# Patient Record
Sex: Male | Born: 2000 | Race: Black or African American | Hispanic: No | Marital: Single | Smoking: Never smoker
Health system: Southern US, Community
[De-identification: ages and names within clinical notes are randomized; demographics above are authoritative.]

## PROBLEM LIST (undated history)

## (undated) DIAGNOSIS — R51 Headache: Secondary | ICD-10-CM

## (undated) DIAGNOSIS — F309 Manic episode, unspecified: Secondary | ICD-10-CM

## (undated) DIAGNOSIS — F329 Major depressive disorder, single episode, unspecified: Secondary | ICD-10-CM

## (undated) DIAGNOSIS — R5383 Other fatigue: Secondary | ICD-10-CM

## (undated) DIAGNOSIS — F32A Depression, unspecified: Secondary | ICD-10-CM

## (undated) DIAGNOSIS — R519 Headache, unspecified: Secondary | ICD-10-CM

## (undated) DIAGNOSIS — F259 Schizoaffective disorder, unspecified: Secondary | ICD-10-CM

## (undated) DIAGNOSIS — F29 Unspecified psychosis not due to a substance or known physiological condition: Secondary | ICD-10-CM

## (undated) DIAGNOSIS — F419 Anxiety disorder, unspecified: Secondary | ICD-10-CM

## (undated) HISTORY — DX: Schizoaffective disorder, unspecified: F25.9

## (undated) HISTORY — DX: Depression, unspecified: F32.A

## (undated) HISTORY — DX: Headache, unspecified: R51.9

## (undated) HISTORY — DX: Anxiety disorder, unspecified: F41.9

## (undated) HISTORY — DX: Manic episode, unspecified: F30.9

## (undated) HISTORY — DX: Major depressive disorder, single episode, unspecified: F32.9

## (undated) HISTORY — DX: Other fatigue: R53.83

## (undated) HISTORY — DX: Unspecified psychosis not due to a substance or known physiological condition: F29

## (undated) HISTORY — DX: Headache: R51

---
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
## 2019-01-10 ENCOUNTER — Encounter: Payer: Self-pay | Admitting: Psychiatry

## 2019-01-10 ENCOUNTER — Ambulatory Visit (INDEPENDENT_AMBULATORY_CARE_PROVIDER_SITE_OTHER): Payer: BLUE CROSS/BLUE SHIELD | Admitting: Psychiatry

## 2019-01-10 VITALS — BP 136/72 | HR 99 | Ht 71.0 in | Wt 145.0 lb

## 2019-01-10 DIAGNOSIS — F411 Generalized anxiety disorder: Secondary | ICD-10-CM | POA: Insufficient documentation

## 2019-01-10 DIAGNOSIS — F250 Schizoaffective disorder, bipolar type: Secondary | ICD-10-CM | POA: Diagnosis not present

## 2019-01-10 DIAGNOSIS — F259 Schizoaffective disorder, unspecified: Secondary | ICD-10-CM | POA: Insufficient documentation

## 2019-01-10 NOTE — Progress Notes (Signed)
[facility] MD/PA/NP Initial Note  01/10/2019 10:42 PM Alan Morrow  MRN:  [id number]  Chief Complaint:  Chief Complaint    Schizophrenia; Manic Behavior; Depression; Anxiety      HPI: Alan Morrow is seen individually and conjointly with both parents 45 minutes face-to-face with consent not collateral at the request of Dr. Toni Arthurs not otherwise specified for adolescent psychiatric interview and exam in evaluation and management of, mood and anxiety symptoms.  Family reports at least 18 months of psychotic symptoms with longstanding history of anxiety likely generalized and course of social and sometimes somatic complaints followed by neurology most which may be only partially reported symptoms also.  Topamax seemed to help his headaches significantly.  Hallucinations are described as auditory more than visual with chaotic piercing qualities he considers frightening while mother sees a dark shadow over his face at times significantly psychotic.  His mood and cognitive energy today are significantly expansive and socially active suggesting hypomania at this time.  However they also describe times of significant involution when he cannot function even separate  from the hallucinations.  He has adventitial bouncing of the right leg as well as nail biting and knuckle popping suggesting significant generalized anxiety.  Significant family history of affective, psychotic, anxiety, and somatic components to symptom course are also noted.  He is not currently exhibiting self-harm or harm to others.  He does not describe morbid fixation but has anxious dream of being in water as if about to drown, but he remains reasonably expansive at times.  He seems to currently be taking Seroquel 500 mg daily and does not acknowledge Lamictal though it may be 150 mg.  Patient is distressed that he has been researching his symptoms and trying to diagnose when he is told by most providers that diagnosis is not valid at his age.  Visit  Diagnosis:    ICD-10-CM   1. Schizoaffective disorder, bipolar type (HCC) F25.0   2. Generalized anxiety disorder F41.1     Past Psychiatric History: Zoloft, Ativan, Abilify possibly Lamictal have been tried with either side effects or no success by dietary, while neurology has treated with propranolol, tryptans, and amitriptyline that much benefit but Topamax was very helpful.  He is currently doing best on Seroquel.  Past Medical History:  Past Medical History:  Diagnosis Date  . Anxiety   . Depression   . Fatigue   . Headache   . Mania (HCC)   . Psychosis (HCC)   . Schizoaffective disorder (HCC)    History reviewed. No pertinent surgical history.  Family Psychiatric History: Family history of schizophrenia, bipolar, addiction, depression and anxiety, suicide attempts, and migraine  Family History:  Family History  Problem Relation Age of Onset  . Migraines Mother   . Anxiety disorder Mother   . Anxiety disorder Sister   . Depression Maternal Aunt   . Suicidality Maternal Aunt   . Depression Paternal Aunt   . Post-traumatic stress disorder Paternal Aunt   . Seizures Paternal Uncle   . Migraines Paternal Uncle   . Depression Paternal Grandmother   . Schizophrenia Other   . Drug abuse Other     Social History:  Social History   Socioeconomic History  . Marital status: Single    Spouse name: Not on file  . Number of children: Not on file  . Years of education: Not on file  . Highest education level: 11th grade  Occupational History  . Not on file  Social Needs  .  Financial resource strain: Not hard at all  . Food insecurity:    Worry: Never true    Inability: Never true  . Transportation needs:    Medical: No    Non-medical: No  Tobacco Use  . Smoking status: Never Smoker  . Smokeless tobacco: Never Used  Substance and Sexual Activity  . Alcohol use: Never    Frequency: Never  . Drug use: Never  . Sexual activity: Never  Lifestyle  . Physical  activity:    Days per week: 0 days    Minutes per session: 0 min  . Stress: Rather much  Relationships  . Social connections:    Talks on phone: Not on file    Gets together: Not on file    Attends religious service: Not on file    Active member of club or organization: Not on file    Attends meetings of clubs or organizations: Not on file    Relationship status: Not on file  Other Topics Concern  . Not on file  Social History Narrative   12th grade student [employer] expecting to graduate though many absences for psychosis now doing better mother reporting patient does not work up to his potential being highly intelligent.    Allergies: No Known Allergies  Metabolic Disorder Labs: No results found for: HGBA1C, MPG No results found for: PROLACTIN No results found for: CHOL, TRIG, HDL, CHOLHDL, VLDL, LDLCALC No results found for: TSH  Therapeutic Level Labs: No results found for: LITHIUM No results found for: VALPROATE No components found for:  CBMZ  Current Medications: Current Outpatient Medications  Medication Sig Dispense Refill  . lamoTRIgine (LAMICTAL) 100 MG tablet Take 150 mg by mouth daily.    . QUEtiapine (SEROQUEL) 100 MG tablet Take 100 mg by mouth at bedtime.    Marland Kitchen QUEtiapine (SEROQUEL) 400 MG tablet Take 400 mg by mouth at bedtime.     No current facility-administered medications for this visit.     Medication Side Effects: none  Orders placed this visit:  No orders of the defined types were placed in this encounter.   Psychiatric Specialty Exam:  Review of Systems  Constitutional: Positive for malaise/fatigue.  HENT: Positive for nosebleeds.   Eyes: Negative.   Respiratory: Negative.   Cardiovascular: Negative.   Gastrointestinal: Positive for nausea.  Genitourinary: Negative.   Musculoskeletal: Negative.   Skin: Negative.   Neurological: Positive for headaches. Negative for dizziness, tremors, sensory change, speech change, focal  weakness, seizures and loss of consciousness.  Endo/Heme/Allergies: Negative.   Psychiatric/Behavioral: Positive for depression and hallucinations. Negative for memory loss, substance abuse and suicidal ideas. The patient is nervous/anxious and has insomnia.   He is right-handed with full range of motion cervical spine and no cranial bruits.  PERRLA 3 mm with EOMs intact.  There are no neurocutaneous stigmata and no neurologic soft signs.  DTRs/AMRs are 0/0. Muscle strengths and tone 5/5, postural reflexes and gait 0/0, and AIMS = 0.  Blood pressure 136/72, pulse 99, height  (1.803 m), weight 145 lb (65.8 kg).Body mass index is 20.22 kg/m.  General Appearance: Casual, Fairly Groomed and Guarded  Eye Contact:  Fair  Speech:  Clear and Coherent, Normal Rate and Talkative  Volume:  Normal  Mood:  Anxious, Euphoric and Euthymic  Affect:  Congruent, Full Range and Anxious  Thought Process:  Goal Directed and Linear  Orientation:  Full (Time, Place, and Person)  Thought Content: Ilusions, Obsessions and Paranoid Ideation  Suicidal Thoughts:  No  Homicidal Thoughts:  No  Memory:  Immediate;   Good Remote;   Good  Judgement:  Fair  Insight:  Fair  Psychomotor Activity:  Normal and Mannerisms  Concentration:  Concentration: Fair and Attention Span: Good  Recall:  Good  Fund of Knowledge: Good  Language: Good  Assets:  Desire for Improvement Physical Health Talents/Skills  ADL's:  Intact  Cognition: WNL  Prognosis:  Fair   Screenings: Adult mood disorder questionnaire endorses 9 out of 13 items simultaneous in occurrence as serious problem including sexual interest has significant bipolar diathesis and disorder  Receiving Psychotherapy: No   Treatment Plan/Recommendations: Over 50% of the time is spent in counseling and coordination of care assuring need, capability, and interest in diagnosis and treatment matching options.  Epic registers Lamictal likely 150 mg daily titrated up  from October though patient and mother do not acknowledge him taking the Lamictal.  However he is taking 500 mg of Seroquel currently daily having titrated up 25 mg mother stressed with the ongoing need to continue increases.  Mother accepts that Seroquel being low potency when he did not tolerate Abilify may more likely have such a stuttering course, though mood and anxiety may be also important to stabilization of the pattern, though he had no benefit from Zoloft or Ativan for anxiety and depression while little benefit from propranolol or amitriptyline for headaches.  Mother notes however that Topamax resolved his headaches.  Addition of lithium to his Seroquel may be the best next step in pharmacotherapy.  He is a good candidate for cognitive behavioral therapy as well, andsuch if pursued through [facility] general psychology might also address corroboratory testing as well as social skills groups.  It is important for the patient to remain active in cognitive and social activity in order to limit the impact of pathology.  Thus far, character and intellect are quite preserved and neurology has significantly cleared him. They return to Dr. Toni Arthurs for final decisions.   Chauncey Mann, MD

## 2019-12-26 ENCOUNTER — Other Ambulatory Visit: Payer: Self-pay

## 2019-12-26 ENCOUNTER — Encounter (HOSPITAL_BASED_OUTPATIENT_CLINIC_OR_DEPARTMENT_OTHER): Payer: Self-pay | Admitting: *Deleted

## 2019-12-26 ENCOUNTER — Emergency Department (HOSPITAL_BASED_OUTPATIENT_CLINIC_OR_DEPARTMENT_OTHER)
Admission: EM | Admit: 2019-12-26 | Discharge: 2019-12-26 | Disposition: A | Discharge status: Home / Self Care | Payer: BC Managed Care – PPO | Attending: Emergency Medicine | Admitting: Emergency Medicine

## 2019-12-26 DIAGNOSIS — R21 Rash and other nonspecific skin eruption: Secondary | ICD-10-CM | POA: Insufficient documentation

## 2019-12-26 NOTE — Discharge Instructions (Signed)
You were seen in the emergency department today with rash.  The rash is resolved and in looking at the pictures on your phone and appears to be from an allergy.  I would like you to follow with your primary care doctor to review your home medications and potentially order additional blood work that cannot be ordered from the emergency department.  If he develop any chest pain, shortness of breath, fever, or sudden severe symptoms he should return to the emergency department.

## 2019-12-26 NOTE — ED Provider Notes (Signed)
Emergency Department Provider Note   I have reviewed the triage vital signs and the nursing notes.   HISTORY  Chief Complaint Rash and Fatigue   HPI Alan Morrow is a 19 y.o. male with past medical history reviewed below presents to the emergency department for evaluation of intermittent rash to the arms with some associated fatigue.  He denies fevers or chills.  He describes a burning sensation intermittently which is all over and sometimes will have rash on his forearms.  He is not having active rash now.  He took Benadryl and symptoms resolved.  Denies any throat swelling or shortness of breath.  No new medications. No radiation of symptoms or other modifying factors.   Past Medical History:  Diagnosis Date  . Anxiety   . Depression   . Fatigue   . Headache   . Mania ([place])   . Psychosis ([place])   . Schizoaffective disorder [facility])     Patient Active Problem List   Diagnosis Date Noted  . Schizoaffective disorder ([place]) 01/10/2019  . Generalized anxiety disorder 01/10/2019    History reviewed. No pertinent surgical history.  Allergies Patient has no known allergies.  Family History  Problem Relation Age of Onset  . Migraines Mother   . Anxiety disorder Mother   . Anxiety disorder Sister   . Depression Maternal Aunt   . Suicidality Maternal Aunt   . Depression Paternal Aunt   . Post-traumatic stress disorder Paternal Aunt   . Seizures Paternal Uncle   . Migraines Paternal Uncle   . Depression Paternal Grandmother   . Schizophrenia Other   . Drug abuse Other     Social History Social History   Tobacco Use  . Smoking status: Never Smoker  . Smokeless tobacco: Never Used  Substance Use Topics  . Alcohol use: Never  . Drug use: Never    Review of Systems  Constitutional: No fever/chills Eyes: No visual changes. ENT: No sore throat. Cardiovascular: Denies chest pain. Respiratory: Denies shortness of breath. Gastrointestinal: No abdominal pain.  No  nausea, no vomiting.  No diarrhea.  No constipation. Genitourinary: Negative for dysuria. Musculoskeletal: Negative for back pain. Skin: Positive rash.  Neurological: Negative for headaches, focal weakness or numbness.  10-point ROS otherwise negative.  ____________________________________________   PHYSICAL EXAM:  VITAL SIGNS: ED Triage Vitals [12/26/19 2007]  Enc Vitals Group     BP (!) 133/92     Pulse Rate (!) 103     Resp 20     Temp 99 F (37.2 C)     Temp Source Oral     SpO2 100 %   Constitutional: Alert and oriented. Well appearing and in no acute distress. Eyes: Conjunctivae are normal.  Head: Atraumatic. Nose: No congestion/rhinnorhea. Mouth/Throat: Mucous membranes are moist. Neck: No stridor.   Cardiovascular: Good peripheral circulation.   Respiratory: Normal respiratory effort.  Gastrointestinal: No distention.  Musculoskeletal: No gross deformities of extremities. Neurologic:  Normal speech and language.  Skin:  Skin is warm, dry and intact. No rash noted.  ____________________________________________   PROCEDURES  Procedure(s) performed:   Procedures  None  ____________________________________________   INITIAL IMPRESSION / ASSESSMENT AND PLAN / ED COURSE  Pertinent labs & imaging results that were available during my care of the patient were reviewed by me and considered in my medical decision making (see chart for details).   Patient presents to the emergency department evaluation of rash with fatigue.  Patient is on Lamictal and Seroquel but  rash the patient shows me on his phone is not consistent with drug eruption.  I have no concern for serious drug rash or developing SJS.  Patient with no rash at this time.  I have advised Benadryl if it returns along with close PCP follow-up.  Patient with no active psychosis or other psychiatric symptoms.  ____________________________________________  FINAL CLINICAL IMPRESSION(S) / ED DIAGNOSES   Final diagnoses:  Rash    Note:  This document was prepared using Dragon voice recognition software and may include unintentional dictation errors.  Alona Bene, MD, [facility] Emergency [facility]    Jori Thrall, Arlyss Repress, MD 12/26/19 [phone]

## 2019-12-26 NOTE — ED Triage Notes (Signed)
Rash and fatigue.

## 2020-03-05 ENCOUNTER — Telehealth: Payer: Self-pay | Admitting: Psychiatry

## 2020-03-05 NOTE — Telephone Encounter (Signed)
Dr. 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  However off of Seroquel, auditory hallucinations quickly resumed and no other antipsychotic has been effective like the Seroquel.  Dr. Rayfield Citizen adding Wellbutrin or Lexapro to the Seroquel for offsetting hyperhidrosis effect.

## 2020-03-05 NOTE — Telephone Encounter (Signed)
Dr. Len Blalock left message on v-mail asking Dr.Jennings to return call for mutual patient Alan Morrow.  Call back   [phone]

## 2020-03-15 ENCOUNTER — Emergency Department (HOSPITAL_COMMUNITY)
Admission: EM | Admit: 2020-03-15 | Discharge: 2020-03-15 | Disposition: A | Discharge status: Home / Self Care | Payer: BC Managed Care – PPO | Attending: Emergency Medicine | Admitting: Emergency Medicine

## 2020-03-15 ENCOUNTER — Encounter (HOSPITAL_COMMUNITY): Payer: Self-pay

## 2020-03-15 ENCOUNTER — Other Ambulatory Visit: Payer: Self-pay

## 2020-03-15 DIAGNOSIS — Z79899 Other long term (current) drug therapy: Secondary | ICD-10-CM | POA: Diagnosis not present

## 2020-03-15 DIAGNOSIS — R55 Syncope and collapse: Secondary | ICD-10-CM

## 2020-03-15 DIAGNOSIS — E860 Dehydration: Secondary | ICD-10-CM | POA: Diagnosis not present

## 2020-03-15 DIAGNOSIS — D61818 Other pancytopenia: Secondary | ICD-10-CM

## 2020-03-15 LAB — DIFFERENTIAL
Abs Immature Granulocytes: 0.01 10*3/uL (ref 0.00–0.07)
Basophils Absolute: 0 10*3/uL (ref 0.0–0.1)
Basophils Relative: 0 %
Eosinophils Absolute: 0 10*3/uL (ref 0.0–0.5)
Eosinophils Relative: 1 %
Immature Granulocytes: 0 %
Lymphocytes Relative: 29 %
Lymphs Abs: 0.9 10*3/uL (ref 0.7–4.0)
Monocytes Absolute: 0.3 10*3/uL (ref 0.1–1.0)
Monocytes Relative: 9 %
Neutro Abs: 1.8 10*3/uL (ref 1.7–7.7)
Neutrophils Relative %: 61 %

## 2020-03-15 LAB — COMPREHENSIVE METABOLIC PANEL
ALT: 21 U/L (ref 0–44)
AST: 17 U/L (ref 15–41)
Albumin: 4.5 g/dL (ref 3.5–5.0)
Alkaline Phosphatase: 97 U/L (ref 38–126)
Anion gap: 9 (ref 5–15)
BUN: 12 mg/dL (ref 6–20)
CO2: 27 mmol/L (ref 22–32)
Calcium: 8.9 mg/dL (ref 8.9–10.3)
Chloride: 108 mmol/L (ref 98–111)
Creatinine, Ser: 1.18 mg/dL (ref 0.61–1.24)
GFR calc Af Amer: >60 mL/min (ref >60)
GFR calc non Af Amer: >60 mL/min (ref >60)
Glucose, Bld: 89 mg/dL (ref 70–99)
Potassium: 3.9 mmol/L (ref 3.5–5.1)
Sodium: 144 mmol/L (ref 135–145)
Total Bilirubin: 0.9 mg/dL (ref 0.3–1.2)
Total Protein: 7.1 g/dL (ref 6.5–8.1)

## 2020-03-15 LAB — CBC
HCT: 35.7 % — ABNORMAL LOW (ref 39.0–52.0)
HCT: 35.2 % — ABNORMAL LOW (ref 39.0–52.0)
Hemoglobin: 11.6 g/dL — ABNORMAL LOW (ref 13.0–17.0)
Hemoglobin: 11.6 g/dL — ABNORMAL LOW (ref 13.0–17.0)
MCH: 31.4 pg (ref 26.0–34.0)
MCH: 31.8 pg (ref 26.0–34.0)
MCHC: 32.5 g/dL (ref 30.0–36.0)
MCHC: 33 g/dL (ref 30.0–36.0)
MCV: 96.7 fL (ref 80.0–100.0)
MCV: 96.4 fL (ref 80.0–100.0)
Platelets: UNDETERMINED 10*3/uL (ref 150–400)
Platelets: 79 10*3/uL — ABNORMAL LOW (ref 150–400)
RBC: 3.69 MIL/uL — ABNORMAL LOW (ref 4.22–5.81)
RBC: 3.65 MIL/uL — ABNORMAL LOW (ref 4.22–5.81)
RDW: 11.4 % — ABNORMAL LOW (ref 11.5–15.5)
RDW: 11.4 % — ABNORMAL LOW (ref 11.5–15.5)
WBC: 2.9 10*3/uL — ABNORMAL LOW (ref 4.0–10.5)
WBC: 2.9 10*3/uL — ABNORMAL LOW (ref 4.0–10.5)
nRBC: 0 % (ref 0.0–0.2)
nRBC: 0 % (ref 0.0–0.2)

## 2020-03-15 LAB — TROPONIN I (HIGH SENSITIVITY): Troponin I (High Sensitivity): 2 ng/L (ref <18)

## 2020-03-15 MED ORDER — SODIUM CHLORIDE 0.9 % IV BOLUS
1000.0000 mL | Freq: Once | INTRAVENOUS | Status: AC
  Administered 2020-03-15: 1000 mL via INTRAVENOUS

## 2020-03-15 MED ORDER — SODIUM CHLORIDE 0.9 % IV BOLUS: 1000.0000 mL | Freq: Once | INTRAVENOUS | Status: DC

## 2020-03-15 MED ORDER — SODIUM CHLORIDE 0.9% FLUSH
3.0000 mL | Freq: Once | INTRAVENOUS | Status: AC
  Administered 2020-03-15: 13:00:00 3 mL via INTRAVENOUS

## 2020-03-15 NOTE — ED Notes (Signed)
Pt verbalizes understanding of DC instructions. Pt belongings returned and is ambulatory out of ED.  

## 2020-03-15 NOTE — ED Provider Notes (Signed)
[facility] DEPT Provider Note   CSN: [id number] Arrival date & time: 03/15/20  1157     History Chief Complaint  Patient presents with  . Loss of Consciousness    Alan Morrow is a 19 y.o. male.  HPI Patient presents with syncope.  States he has had 3 episodes today.  States he began to stand and passed out.  States he did not drink much water today.  Reportedly had orthostatic changes with EMS.  Around a month ago was changed from Seroquel to Zyprexa.  States that it was a taper and ramp up.  Has been doing well.  At times will of dull chest pain.  No fevers.  Does not feel his heart racing.  No history of passing out.  No swelling in his legs.  Denies drug use.  Had 750 mL of normal saline with EMS and patient states he is feeling better.    Past Medical History:  Diagnosis Date  . Anxiety   . Depression   . Fatigue   . Headache   . Mania ([place])   . Psychosis ([place])   . Schizoaffective disorder [facility])     Patient Active Problem List   Diagnosis Date Noted  . Schizoaffective disorder ([place]) 01/10/2019  . Generalized anxiety disorder 01/10/2019    History reviewed. No pertinent surgical history.     Family History  Problem Relation Age of Onset  . Migraines Mother   . Anxiety disorder Mother   . Anxiety disorder Sister   . Depression Maternal Aunt   . Suicidality Maternal Aunt   . Depression Paternal Aunt   . Post-traumatic stress disorder Paternal Aunt   . Seizures Paternal Uncle   . Migraines Paternal Uncle   . Depression Paternal Grandmother   . Schizophrenia Other   . Drug abuse Other     Social History   Tobacco Use  . Smoking status: Never Smoker  . Smokeless tobacco: Never Used  Substance Use Topics  . Alcohol use: Never  . Drug use: Never    Home Medications Prior to Admission medications   Medication Sig Start Date End Date Taking? Authorizing Provider  lamoTRIgine (LAMICTAL) 100 MG tablet Take 100 mg by mouth  daily.  01/09/19  Yes [provider]  QUEtiapine (SEROQUEL) 400 MG tablet Take 200 mg by mouth at bedtime.  12/30/18  Yes [provider]    Allergies    Naproxen  Review of Systems   Review of Systems  Constitutional: Negative for appetite change and fever.  HENT: Negative for congestion.   Cardiovascular: Positive for chest pain.  Gastrointestinal: Negative for abdominal pain.  Musculoskeletal: Negative for back pain.  Skin: Negative for rash.  Neurological: Positive for syncope and light-headedness.  Psychiatric/Behavioral: Negative for confusion.    Physical Exam Updated Vital Signs BP 117/76   Pulse 74   Temp 98.5 F (36.9 C) (Oral)   Resp (!) 21   Ht 5\' 10"  (1.778 m)   Wt 72.6 kg   SpO2 100%   BMI 22.96 kg/m   Physical Exam Vitals and nursing note reviewed.  HENT:     Head: Normocephalic.  Eyes:     Extraocular Movements: Extraocular movements intact.  Cardiovascular:     Rate and Rhythm: Regular rhythm.     Heart sounds: No murmur.  Pulmonary:     Breath sounds: No wheezing or rhonchi.  Abdominal:     Tenderness: There is no abdominal tenderness.  Musculoskeletal:  General: No tenderness.     Cervical back: Neck supple.  Skin:    General: Skin is warm.  Neurological:     Mental Status: He is alert. Mental status is at baseline.     ED Results / Procedures / Treatments   Labs (all labs ordered are listed, but only abnormal results are displayed) Labs Reviewed  CBC - Abnormal; Notable for the following components:      Result Value   WBC 2.9 (*)    RBC 3.69 (*)    Hemoglobin 11.6 (*)    HCT 35.7 (*)    RDW 11.4 (*)    All other components within normal limits  CBC - Abnormal; Notable for the following components:   WBC 2.9 (*)    RBC 3.65 (*)    Hemoglobin 11.6 (*)    HCT 35.2 (*)    RDW 11.4 (*)    All other components within normal limits  DIFFERENTIAL  COMPREHENSIVE METABOLIC PANEL  TROPONIN I (HIGH  SENSITIVITY)    EKG EKG Interpretation  Date/Time:  Friday March 15 2020 12:12:28 EDT Ventricular Rate:  70 PR Interval:    QRS Duration: 93 QT Interval:  363 QTC Calculation: 392 R Axis:   80 Text Interpretation: Sinus rhythm LVH by voltage ST elevation suggests acute pericarditis vs repol No old tracing to compare Confirmed by Benjiman Core [phone]) on 03/15/2020 1:38:41 PM   Radiology No results found.  Procedures Procedures (including critical care time)  Medications Ordered in ED Medications  sodium chloride 0.9 % bolus 1,000 mL (has no administration in time range)  sodium chloride flush (NS) 0.9 % injection 3 mL (3 mLs Intravenous Given 03/15/20 1254)  sodium chloride 0.9 % bolus 1,000 mL (1,000 mLs Intravenous New Bag/Given 03/15/20 1501)    ED Course  I have reviewed the triage vital signs and the nursing notes.  Pertinent labs & imaging results that were available during my care of the patient were reviewed by me and considered in my medical decision making (see chart for details).    MDM Rules/Calculators/A&P                      Patient with syncopal episode.  Reportedly orthostatic initially.  Recent changes in psych medicines also.  Nonspecific EKG changes but troponin negative.  Does have low WBC and hemoglobin.  Has not had any reported bleeding.  Platelets were not able to be measured.  Differential pending.  Care turned over to Dr. Anitra Lauth. Final Clinical Impression(s) / ED Diagnoses Final diagnoses:  Syncope, unspecified syncope type  [facility])    Rx / DC Orders ED Discharge Orders    None       Benjiman Core, MD 03/15/20 1512

## 2020-03-15 NOTE — ED Provider Notes (Signed)
CBC with leukopenia but normal diff.  CMP wnl and trop in neg.  Pt feeling better and BP now normal.  Patient will need to follow-up with his PCP for recheck of the leukopenia.   Gwyneth Sprout, MD 03/15/20 1626

## 2020-03-15 NOTE — ED Triage Notes (Signed)
Per EMS- Patient is from home. Patient's mother states the patient was taken off of some of his psych meds a few weeks ago. Today the patient began having syncopal episodes x 3 when he tried to stand. Patient did have orthostatic changes with EMS.  Pataent was given NS 750 ml prior to arrival to the ED.

## 2020-09-04 ENCOUNTER — Encounter: Payer: Self-pay | Admitting: Psychiatry

## 2021-01-27 ENCOUNTER — Other Ambulatory Visit: Payer: Self-pay

## 2021-01-27 ENCOUNTER — Ambulatory Visit (INDEPENDENT_AMBULATORY_CARE_PROVIDER_SITE_OTHER): Payer: BC Managed Care – PPO | Admitting: Psychiatry

## 2021-01-27 ENCOUNTER — Encounter: Payer: Self-pay | Admitting: Psychiatry

## 2021-01-27 DIAGNOSIS — F259 Schizoaffective disorder, unspecified: Secondary | ICD-10-CM | POA: Diagnosis not present

## 2021-01-27 NOTE — Progress Notes (Signed)
[facility] Counselor Initial Adult Exam  Name: Alan Morrow Date: 01/27/2021 MRN: [id number] DOB: 12/25/2000 PCP: Berniece Salines, PA  Time spent: 50 minutes start time 2:10 PM   Guardian/Payee:  patient    Paperwork requested:  Yes   Reason for Visit /Presenting Problem: Patient was present for session.  He shared that his mother and provider stated that he needed to try therapy.  Seeing Dr. Toni Arthurs for medication. He stated he feels medication is working pretty well.  He reported Mother has stated he needs therapy for 2 years.  He stated he feels he is in a bad mood all the time and it has been making him feel down.  He was going to [employer] but has taken a break this year.  Last year was good.  He stated he liked his roommate last year but had a new one this year. He stated he was having lots of Suicidal thoughts so he had to take his stressors away. He was hospitalized in September and has been working on getting medications right since than.  Currently he is on Ativan and Jordan.  Saw Dr. Marlyne Beards for a second opinion in the past.  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 Played some in [employer] but quit to work. Lost friend in a car accident and another in an Overdose. Been here whole life in [place].  Liked going to [employer] trying to get a Psychology degree. Doesn't have a lot of time with friends. Patient explained that he wants to figure out how to manage emotions so that he can return to school.  He was encouraged to think about what he would like to learn int treatment to be discussed at next session  Mental Status Exam:   Appearance:   Casual     Behavior:  Appropriate  Motor:  Normal  Speech/Language:   Pressured  Affect:  Appropriate  Mood:  anxious  Thought process:  racing thoughts  Thought content:    Hallucinations: Auditory  Sensory/Perceptual disturbances:    Hallucinations: Auditory  Orientation:  oriented to person, place,  time/date and situation  Attention:  Fair  Concentration:  Fair  Memory:  WNL  Fund of knowledge:   Fair  Insight:    Fair  Judgment:   Fair  Impulse Control:  Fair   Reported Symptoms:  Intrusive thoughts, racing thoughts, depression, hallucinations, impulsive behaviors, sleep issues, anxiety, rumination, irritability, weight loss,   Risk Assessment: Danger to Self:  Yes.  without intent/plan he explained he has had the intrusive thoughts for a long time and won't act on them Self-injurious Behavior: No Danger to Others: No Duty to Warn:no Physical Aggression / Violence:No  Access to Firearms a concern: No  Gang Involvement:No  Patient / guardian was educated about steps to take if suicide or homicide risk level increases between visits: yes While future psychiatric events cannot be accurately predicted, the patient does not currently require acute inpatient psychiatric care and does not currently meet [facility] involuntary commitment criteria.  Substance Abuse History: Current substance abuse: No     Past Psychiatric History:   Previous psychological history is significant for depression 1 time after d/c Outpatient Providers:Dr. Toni Arthurs for [place] History of Psych Hospitalization: Yes  Psychological Testing: none   Abuse History: Victim of No., none   Report needed: No. Victim of Neglect:No. Perpetrator of none  Witness / Exposure to Domestic Violence: No   Protective Services Involvement:  No  Witness to Community Violence:  No   Family History:  Family History  Problem Relation Age of Onset  . Migraines Mother   . Anxiety disorder Mother   . Anxiety disorder Sister   . Depression Maternal Aunt   . Suicidality Maternal Aunt   . Depression Paternal Aunt   . Post-traumatic stress disorder Paternal Aunt   . Seizures Paternal Uncle   . Migraines Paternal Uncle   . Depression Paternal Grandmother   . Schizophrenia Other   . Drug abuse Other     Living  situation: the patient lives with their family sister 13, sister 57  Sexual Orientation:  Straight  Relationship Status: single  Name of spouse / other:none             If a parent, number of children / ages:none  Support Systems; hold it in and don't talk about it  Financial Stress:  No   Income/Employment/Disability: Supported by [employer]  Financial planner: No   Educational History: Education: some college  Oncologist:   grew up in [place] but not there as much currently  Any cultural differences that may affect / interfere with treatment:  not applicable   Recreation/Hobbies: video games, TV, hang out with friends  Stressors:Other: thinking, racting thoughts future what's next  Strengths:  adaptable  Barriers:  nothing   Legal History: Pending legal issue / charges: The patient has no significant history of legal issues. History of legal issue / charges: none  Medical History/Surgical History:reviewed Past Medical History:  Diagnosis Date  . Anxiety   . Depression   . Fatigue   . Headache   . Mania ([facility])   . Psychosis ([facility])   . Schizoaffective disorder ([facility])     History reviewed. No pertinent surgical history.  Medications: Current Outpatient Medications  Medication Sig Dispense Refill  . lamoTRIgine (LAMICTAL) 100 MG tablet Take 100 mg by mouth daily.  (Patient not taking: Reported on 01/27/2021)    . QUEtiapine (SEROQUEL) 400 MG tablet Take 200 mg by mouth at bedtime.  (Patient not taking: Reported on 01/27/2021)     No current facility-administered medications for this visit.    Allergies  Allergen Reactions  . Naproxen     Other reaction(s): Unknown    Diagnoses:    ICD-10-CM   1. Schizoaffective disorder, unspecified type ([facility])  F25.9     Plan of Care: Patient is to develop treatment plan and set goals at next session.  Patient is to continue with Dr. Toni Arthurs for medication management.   Stevphen Meuse, [facility]

## 2021-03-12 ENCOUNTER — Ambulatory Visit (INDEPENDENT_AMBULATORY_CARE_PROVIDER_SITE_OTHER): Payer: BC Managed Care – PPO | Admitting: Psychiatry

## 2021-03-12 ENCOUNTER — Telehealth: Payer: Self-pay | Admitting: Psychiatry

## 2021-03-12 DIAGNOSIS — F259 Schizoaffective disorder, unspecified: Secondary | ICD-10-CM | POA: Diagnosis not present

## 2021-03-12 NOTE — Telephone Encounter (Signed)
Mr. umair, rosiles are scheduled for a virtual visit with your provider today.    Just as we do with appointments in the office, we must obtain your consent to participate.  Your consent will be active for this visit and any virtual visit you may have with one of our providers in the next 365 days.    If you have a MyChart account, I can also send a copy of this consent to you electronically.  All virtual visits are billed to your insurance company just like a traditional visit in the office.  As this is a virtual visit, video technology does not allow for your provider to perform a traditional examination.  This may limit your provider's ability to fully assess your condition.  If your provider identifies any concerns that need to be evaluated in person or the need to arrange testing such as labs, EKG, etc, we will make arrangements to do so.    Although advances in technology are sophisticated, we cannot ensure that it will always work on either your end or our end.  If the connection with a video visit is poor, we may have to switch to a telephone visit.  With either a video or telephone visit, we are not always able to ensure that we have a secure connection.   I need to obtain your verbal consent now.   Are you willing to proceed with your visit today?   Duard Brady has provided verbal consent on 03/12/2021 for a virtual visit (video or telephone).   Stevphen Meuse, [facility] 03/12/2021  1:07 PM

## 2021-03-12 NOTE — Progress Notes (Signed)
[facility] Counselor/Therapist Progress Note  Patient ID: Alan Morrow, MRN: [id number],    Date: 03/12/2021  Time Spent: 47 minutes start time 1:06 PM Virtual Visit via Telephone Note Connected with patient by a video enabled telemedicine/telehealth application or telephone, with their informed consent, and verified patient privacy and that I am speaking with the correct person using two identifiers. I discussed the limitations, risks, security and privacy concerns of performing psychotherapy and management service by telephone and the availability of in person appointments. I also discussed with the patient that there may be a patient responsible charge related to this service. The patient expressed understanding and agreed to proceed. I discussed the treatment planning with the patient. The patient was provided an opportunity to ask questions and all were answered. The patient agreed with the plan and demonstrated an understanding of the instructions. The patient was advised to call  our office if  symptoms worsen or feel they are in a crisis state and need immediate contact.   Therapist Location: office Patient Location: home    Treatment Type: Individual Therapy  Reported Symptoms: anxiety, irritability, sadness, intrusive thoughts, panic attacks, rumination  Mental Status Exam:  Appearance:   NA     Behavior:  na  Motor:  na  Speech/Language:   Pressured  Affect:  NA  Mood:  anxious  Thought process:  normal  Thought content:    WNL  Sensory/Perceptual disturbances:    WNL  Orientation:  oriented to person, place, time/date and situation  Attention:  Good  Concentration:  Good  Memory:  WNL  Fund of knowledge:   Good  Insight:    Good  Judgment:   Good  Impulse Control:  Good   Risk Assessment: Danger to Self:  No Self-injurious Behavior: No Danger to Others: No Duty to Warn:no Physical Aggression / Violence:No  Access to Firearms a concern: No  Gang  Involvement:No   Subjective: Met with patient via phone session. He shared that he had been feeling stuck and not sure what he was going to do in the future.  He started having the intrusive thoughts of self harm but nothing he would act on, but the thoughts have stopped.  Patient was able to develop treatment plan and set goals in session.  He identified his anxiety as what he wanted to address in treatment.  Patient stated he was struggling currently because he had no routine.  Talked with patient about the importance of preparing himself and his brain to return to school if that was his goal.  Encouraged him to start developing daily routines.  Patient was able to report wanting to get up at 7 and start his breakfast encouraged him to start working on some sort of learning since that would was what he reported doing next when he was in college.  He explained he was taking [place] and psychology.  Encourage patient to go online to find different courses on those subjects for free that he can engage in.  He also shared that he enjoyed basketball so encouraged him to exercise.  Patient stated at this time all he is doing is some chores.  He shared he is only going places a few times a month and he has lots of anxiety about going in public.  Encouraged him to realize he has to start feeling more comfortable being in public so he was asked to try and make small trips out 3 times a week to places  like a park or an outside activity.  Patient reported feeling positive about plans from session and agreed to start working on things.  Interventions: Solution-Oriented/Positive Psychology  Diagnosis:   ICD-10-CM   1. Schizoaffective disorder, unspecified type ([place])  F25.9     Plan: Is to start following routine developed in session.  Patient is to start trying to go to an activity outside of his home 3 times a week.  Patient is to let his parents know if his intrusive thoughts get to a point where he cannot manage  them appropriately.  Patient is to take medication as directed. Long-term goal: Stabilize anxiety level while increasing ability to function on a daily basis Short-term goal: Increase understanding of beliefs and messages that produce the worry and anxiety  [name], [facility]

## 2021-03-26 ENCOUNTER — Ambulatory Visit (INDEPENDENT_AMBULATORY_CARE_PROVIDER_SITE_OTHER): Payer: BC Managed Care – PPO | Admitting: Psychiatry

## 2021-03-26 DIAGNOSIS — F259 Schizoaffective disorder, unspecified: Secondary | ICD-10-CM | POA: Diagnosis not present

## 2021-03-26 NOTE — Progress Notes (Signed)
[facility] Counselor/Therapist Progress Note  Patient ID: Alan Morrow, MRN: [id number],    Date: 03/26/2021  Time Spent: 50 minutes start time 1:07 PM end time 1:57 PM Virtual Visit via Telephone Note Connected with patient by a video enabled telemedicine/telehealth application, with their informed consent, and verified patient privacy and that I am speaking with the correct person using two identifiers. I discussed the limitations, risks, security and privacy concerns of performing psychotherapy and management service by telephone and the availability of in person appointments. I also discussed with the patient that there may be a patient responsible charge related to this service. The patient expressed understanding and agreed to proceed. I discussed the treatment planning with the patient. The patient was provided an opportunity to ask questions and all were answered. The patient agreed with the plan and demonstrated an understanding of the instructions. The patient was advised to call  our office if  symptoms worsen or feel they are in a crisis state and need immediate contact.   Therapist Location: office Patient Location: home    Treatment Type: Individual Therapy  Reported Symptoms: sleep issues, sadness, intrusive thoughts,rumination, anxiety, headaches,anger, irritability, hearing voices  Mental Status Exam:  Appearance:   Casual and Neat     Behavior:  Appropriate  Motor:  Normal  Speech/Language:   pressured  Affect:  Appropriate  Mood:  anxious  Thought process:  normal  Thought content:    WNL  Sensory/Perceptual disturbances:    WNL  Orientation:  oriented to person, place, time/date and situation  Attention:  Good  Concentration:  Good  Memory:  WNL  Fund of knowledge:   Good  Insight:    fair  Judgment:   fair  Impulse Control:  Fair   Risk Assessment: Danger to Self:  No patient reported at times he has intrusive thoughts but they are nothing that he  will act on and that if they were to get to a bad enough point he would let his parents know so he can go to the emergency room. Self-injurious Behavior: No Danger to Others: No Duty to Warn:no Physical Aggression / Violence:No  Access to Firearms a concern: No  Gang Involvement:No   Subjective: Met with patient via virtual session.  He shared that he gets frustrated with himself at night.  He stated he is doing everything can and still is having the voices and that is upsetting for him. Patient shared that the voices are more of a whisper than a scream. He shared he wants to go back in the fall to school and he is not sure that is going to happen.  Patient was encouraged to realize he might not be able to go back to school full-time but that does not mean he cannot start taking some classes.  Encouraged him to start exploring different options for the fall rather than just thinking he has to go back full-time or not at all.  He was able to identify different people he could contact to find out that information and also felt easing into a full-time student schedule may be a better option for him anyway since he continues to work on getting rid of the voices completely.  Discussed CBT skill ST oh peepee with patient.  Encouraged him to practice the skill whenever he is having the extreme anxiety and automatic negative thoughts.  He was encouraged to write out the facts/truth about himself and his capabilities to repeat to himself in  those different situations as well.  He was able to agree that he got into college and graduated on his own ability so there is potential for him to continue in that direction.  Patient was also encouraged to find ways to release the negative emotions that will be surfacing due to his frustration.  He is to keep his brain engaged in positive activities like learning [place] and working on [employer] to prepare his brain for college work as well.  Patient reported feeling  positive about plans from session and agreed to try and start working in those directions.  Interventions: Cognitive Behavioral Therapy and Solution-Oriented/Positive Psychology  Diagnosis:   ICD-10-CM   1. Schizoaffective disorder, unspecified type ([place])  F25.9     Plan: Patient is to use CBT and coping skills to decrease anxiety symptoms.  Patient is to start exploring different options for his fall semester to see what skin to be the most appropriate fit for him.  Patient is to practice CBT ST OP P exercise.  Patient is to write out the facts/truth concerning him getting into school to review when automatic negative thoughts surface.  Patient is to use physical activity to release negative emotions appropriately.  Patient is to take medication as directed Long-term goal: Stabilize anxiety level while increasing ability to function on a daily basis Short-term goal: Increase understanding of beliefs and messages that produce the worry and anxiety  Alan Morrow, [facility]

## 2021-04-09 ENCOUNTER — Ambulatory Visit (INDEPENDENT_AMBULATORY_CARE_PROVIDER_SITE_OTHER): Payer: BC Managed Care – PPO | Admitting: Psychiatry

## 2021-04-09 DIAGNOSIS — F259 Schizoaffective disorder, unspecified: Secondary | ICD-10-CM | POA: Diagnosis not present

## 2021-04-09 NOTE — Progress Notes (Signed)
[facility] Counselor/Therapist Progress Note  Patient ID: Alan Morrow, MRN: [id number],    Date: 04/09/2021  Time Spent: 45 minutes start time 1:10 PM end time 1:55 PM Virtual Visit via Telephone Note Connected with patient by a video enabled telemedicine/telehealth application, with their informed consent, and verified patient privacy and that I am speaking with the correct person using two identifiers. I discussed the limitations, risks, security and privacy concerns of performing psychotherapy and management service by telephone and the availability of in person appointments. I also discussed with the patient that there may be a patient responsible charge related to this service. The patient expressed understanding and agreed to proceed. I discussed the treatment planning with the patient. The patient was provided an opportunity to ask questions and all were answered. The patient agreed with the plan and demonstrated an understanding of the instructions. The patient was advised to call  our office if  symptoms worsen or feel they are in a crisis state and need immediate contact.   Therapist Location: office Patient Location: home    Treatment Type: Individual Therapy  Reported Symptoms: depressed, intrusive thoughts, anxiety, isolation, racing thoughts, sleep issues  Mental Status Exam:  Appearance:   Casual     Behavior:  Appropriate  Motor:  Normal  Speech/Language:   Normal Rate  Affect:  Appropriate  Mood:  anxious and sad  Thought process:  normal  Thought content:    WNL  Sensory/Perceptual disturbances:    WNL  Orientation:  oriented to person, place, time/date and situation  Attention:  Good  Concentration:  Good  Memory:  WNL  Fund of knowledge:   Fair  Insight:    Fair  Judgment:   Good  Impulse Control:  Fair   Risk Assessment: Danger to Self:  +SI at times but no intent by patient report, he agreed that he would go to [employer] [facility] if  needed Self-injurious Behavior: No Danger to Others: No Duty to Warn:no Physical Aggression / Violence:No  Access to Firearms a concern: No  Gang Involvement:No   Subjective: Met with patient via virtual session. He shared that he was not doing as well since the increase in medication.  He shared that he had talked with provider yesterday and talks to them weekly.  He shared that he feels like he has no real purpose because he doesn't really do anything. He went on to share that he tried some of the techniques from last session and the didn't help as much as he had hoped. Patient also shared that he cancelled several different things he was supposed to do with friends due to anxiety.  Encouraged him to realize that isolation is not healthy and that he has been in the house for a year now so isolating is his new normal.  Encouraged him to realize he has to make baby steps and use positive self talk to get back out into public and develop a new normal before he tries to go to college in the fall.  Discussed having him go to the store a couple times over the next week.  Discussed CBT cycle and how to use self talk to get through the time out of the house. He reported that he is not wanting to do anything most of the time.  He was able to report that he wants to stream today and that is something he does every off day and that takes up most of the day. He  is also doing his [place] everyday and that has been a good thing. Encouraged him to try and start spending about 30 minutes a day at least with his parents.   Patient agreed to work on plans from session and to try and have more social contact.  Interventions: Cognitive Behavioral Therapy and Solution-Oriented/Positive Psychology  Diagnosis:   ICD-10-CM   1. Schizoaffective disorder, unspecified type ([place])  F25.9     Plan: Patient is to use CBT and coping skills to decrease anxiety symptoms.  Patient is to try and start having more social contact by  going to stores for a few minutes a few times each week.  Patient is to try and spend 30 minutes with his parents each day.  Patient is to continue working on his [place] and finding time to exercise.  Patient is to take medication as directed.  Patient is to follow safety plan discussed in session. Long-term goal: Stabilize anxiety level while increasing ability to function on a daily basis Short-term goal: Increase understanding of beliefs and messages that produce the worry and anxiety   [name], [facility]

## 2021-08-18 ENCOUNTER — Ambulatory Visit (INDEPENDENT_AMBULATORY_CARE_PROVIDER_SITE_OTHER): Payer: BC Managed Care – PPO | Admitting: Psychiatry

## 2021-08-18 ENCOUNTER — Encounter: Payer: Self-pay | Admitting: Psychiatry

## 2021-08-18 DIAGNOSIS — F259 Schizoaffective disorder, unspecified: Secondary | ICD-10-CM

## 2021-08-18 NOTE — Progress Notes (Signed)
[facility] Counselor/Therapist Progress Note  Patient ID: Alan Morrow, MRN: [id number],    Date: 08/18/2021  Time Spent: 47 minutes 11:06 AM end time 11:53 AM Virtual Visit via video note Connected with patient by a telemedicine/telehealth application, with their informed consent, and verified patient privacy and that I am speaking with the correct person using two identifiers. I discussed the limitations, risks, security and privacy concerns of performing psychotherapy and the availability of in person appointments. I also discussed with the patient that there may be a patient responsible charge related to this service. The patient expressed understanding and agreed to proceed. I discussed the treatment planning with the patient. The patient was provided an opportunity to ask questions and all were answered. The patient agreed with the plan and demonstrated an understanding of the instructions. The patient was advised to call  our office if  symptoms worsen or feel they are in a crisis state and need immediate contact.   Therapist Location: office Patient Location: home    Treatment Type: Individual Therapy  Reported Symptoms: auditory hallucinations, sleep issues, sadness, intrusive thoughts, focusing issues, anxiety  Mental Status Exam:  Appearance:   Casual     Behavior:  Appropriate  Motor:  Normal  Speech/Language:   Pressured speech  Affect:  Appropriate  Mood:  anxious  Thought process:  circumstantial  Thought content:    Hallucinations: Auditory  Sensory/Perceptual disturbances:    Hallucinations: Auditory  Orientation:  oriented to person, place, time/date, and situation  Attention:  Good  Concentration:  Good  Memory:  WNL  Fund of knowledge:   Good  Insight:    Good  Judgment:   Good  Impulse Control:  Good   Risk Assessment: Danger to Self:   patient admitted to having intrusive thoughts but he stated he recognizes that they are just thought and he would  not act on any of them/ Self-injurious Behavior: No Danger to Others: No Duty to Warn:no Physical Aggression / Violence:No  Access to Firearms a concern: No  Gang Involvement:No   Subjective: Met with patient via virtual session.  He shared he was doing okay until recently when he was taken off his medication due to side effects. He shared that things have been up and down recently. He was supposed to move but it fell.  He shared he is getting anxious around his friends, so he isn't getting together with them in person anymore.  Had patient do EMDR set on the issue, suds level 9, negative cognition "I am weird ", felt anxiety in his face and chest.  Reduce suds level to 3.  Through the processing patient was able to recognize he is struggling with the fact that he is hearing the voices and does not want to do anything strange in front of his friends and so that is why he is feeling the anxiety about being around them and isolating.  Patient was encouraged to continue finding physical ways to release negative emotions appropriately through exercise.  He was encouraged to continue using skills from previous sessions that he shared had been helpful.  Interventions: Cognitive Behavioral Therapy, Eye Movement Desensitization and Reprocessing (EMDR), and Insight-Oriented  Diagnosis:   ICD-10-CM   1. Schizoaffective disorder, unspecified type ([place])  F25.9       Plan: Patient is to use CBT and coping skills to decrease anxiety symptoms.  Patient is to continue keeping his brain engaged in positive activities like learning at [place] and  [place] and playing video games with friends on line.  Patient is to exercise to release negative emotions appropriately.  Patient is to continue working with his provider to manage medication issues. Long-term goal: Stabilize anxiety level while increasing ability to function on a daily basis Short-term goal: Increase understanding of beliefs and messages that produce  the worry and anxiety    [name], [facility]

## 2021-09-01 ENCOUNTER — Ambulatory Visit (INDEPENDENT_AMBULATORY_CARE_PROVIDER_SITE_OTHER): Payer: BC Managed Care – PPO | Admitting: Psychiatry

## 2021-09-01 DIAGNOSIS — F259 Schizoaffective disorder, unspecified: Secondary | ICD-10-CM

## 2021-09-01 NOTE — Progress Notes (Signed)
[facility] Counselor/Therapist Progress Note  Patient ID: Gay Rape, MRN: [id number],    Date: 09/01/2021  Time Spent: 46 minutes start time 11:00 AM end time 11:46 AM Virtual Visit via Video Note Connected with patient by a telemedicine/telehealth application, with their informed consent, and verified patient privacy and that I am speaking with the correct person using two identifiers. I discussed the limitations, risks, security and privacy concerns of performing psychotherapy and the availability of in person appointments. I also discussed with the patient that there may be a patient responsible charge related to this service. The patient expressed understanding and agreed to proceed. I discussed the treatment planning with the patient. The patient was provided an opportunity to ask questions and all were answered. The patient agreed with the plan and demonstrated an understanding of the instructions. The patient was advised to call  our office if  symptoms worsen or feel they are in a crisis state and need immediate contact.   Therapist Location: office Patient Location: home    Treatment Type: Individual Therapy  Reported Symptoms: intrusive thoughts, depression, hallucination, anxiety, racing thoughts  Mental Status Exam:  Appearance:   Casual     Behavior:  Appropriate  Motor:  Normal  Speech/Language:   Normal Rate  Affect:  Appropriate  Mood:  normal  Thought process:  Just woke up so a little slow at first  Thought content:    WNL  Sensory/Perceptual disturbances:    WNL  Orientation:  oriented to person, place, time/date, and situation  Attention:  Good  Concentration:  Good  Memory:  WNL  Fund of knowledge:   Good  Insight:    Good  Judgment:   Good  Impulse Control:  Good   Risk Assessment: Danger to Self:  has thoughts but contracts for safety Self-injurious Behavior: No Danger to Others: No Duty to Warn:no Physical Aggression / Violence:No   Access to Firearms a concern: No  Gang Involvement:No   Subjective: Met with patient via virtual session. Patient reported having COVID recently so he hasn't done much recently. He shared that things have been okay.  He shared that his provider is still working on the medication.  He explained that he is talking himself through the hallucinations.  Patient wanted to process that he is in basically unable to see friends for a year, suds level 8, negative cognition "my life is on hold" felt frustration in his head.  Patient was able to reduce suds level.  He shared that he was not able to go as far as he would have liked at this session.  Had patient think through someone he would be willing to go and interact with.  He was able to recognize that his grandparents were people that he felt positive about and would interact with them.  Patient agreed to try and see them and connect with them a couple times between sessions to start having more contact with others.  Interventions: Solution-Oriented/Positive Psychology, Insight-Oriented, and BS P  Diagnosis:   ICD-10-CM   1. Schizoaffective disorder, unspecified type ([place])  F25.9       Plan: Patient is to use coping skills to decrease anxiety symptoms.  Patient is to go and spend time with his grandparents for a couple times between sessions.  Patient is to work on self talk.  Patient is to take medication as directed. Long-term goal: Stabilize anxiety level while increasing ability to function on a daily basis Short-term goal: Increase  understanding of beliefs and messages that produce the worry and anxiety  [name], [facility]

## 2021-09-15 ENCOUNTER — Ambulatory Visit (INDEPENDENT_AMBULATORY_CARE_PROVIDER_SITE_OTHER): Payer: BC Managed Care – PPO | Admitting: Psychiatry

## 2021-09-15 DIAGNOSIS — F259 Schizoaffective disorder, unspecified: Secondary | ICD-10-CM | POA: Diagnosis not present

## 2021-09-15 NOTE — Progress Notes (Signed)
[facility] Counselor/Therapist Progress Note  Patient ID: Alan Morrow, MRN: [id number],    Date: 09/15/2021  Time Spent: 44 minute start time 11:06 AM end time 11:50 AM Virtual Visit via Video Note Connected with patient by a telemedicine/telehealth application, with their informed consent, and verified patient privacy and that I am speaking with the correct person using two identifiers. I discussed the limitations, risks, security and privacy concerns of performing psychotherapy and the availability of in person appointments. I also discussed with the patient that there may be a patient responsible charge related to this service. The patient expressed understanding and agreed to proceed. I discussed the treatment planning with the patient. The patient was provided an opportunity to ask questions and all were answered. The patient agreed with the plan and demonstrated an understanding of the instructions. The patient was advised to call  our office if  symptoms worsen or feel they are in a crisis state and need immediate contact.   Therapist Location: office Patient Location: home    Treatment Type: Individual Therapy  Reported Symptoms: anxiety, intrusive thoughts, hallucinations, sadness, ruminations  Mental Status Exam:  Appearance:   Casual and Neat     Behavior:  Appropriate  Motor:  Normal  Speech/Language:   pressured  Affect:  Appropriate  Mood:  anxious and sad  Thought process:  normal  Thought content:    Hallucinations: Auditory Visual  Sensory/Perceptual disturbances:    WNL  Orientation:  oriented to person, place, time/date, and situation  Attention:  Good  Concentration:  Good  Memory:  WNL  Fund of knowledge:   Fair  Insight:    Good  Judgment:   Good  Impulse Control:  Good   Risk Assessment: Danger to Self:  Yes.  without intent/plan contracting for safety Self-injurious Behavior: No Danger to Others: No Duty to Warn:no Physical Aggression /  Violence:No  Access to Firearms a concern: No  Gang Involvement:No   Subjective: Met with patient via virtual session. Patient shared his grandmother came over for a visit and that was a good thing. He stated that she had come over because he couldn't get to either of his grandparents home. He explained he is still off his medication and waiting to hear from his provider about how they are going to proceed.  He shared that he has been using CBT skills from previous sessions to manage any of the intrusive thoughts and keep them from overwhelming him.  Patient wanted to discuss the treatment plan.  Some revisions were made to the treatment plan could not sign due to being a virtual session.  Patient reported that he wanted to focus more warm on developing more CBT and coping skills to manage the emotions and intrusive thoughts.  Discussed different CBT metaphors and developed some different ones that he can utilize based on things that he enjoys.  Patient was encouraged to practice them to help decrease anxiety.  He shared that he is starting to have more anxiety because the holidays are coming and people will be coming to his home.  Discussed different ways he could start using his tools and the importance of trying to interact more with his family members as they come over so that when everybody is at his house he is not so overwhelmed.  Patient reported feeling positive about plans from session and agreed to practice skills to see which one is most effective.  Interventions: Cognitive Behavioral Therapy and Solution-Oriented/Positive Psychology  Diagnosis:   ICD-10-CM   1. Schizoaffective disorder, unspecified type ([place])  F25.9       Plan: Patient is to use CBT and coping skills to decrease anxiety symptoms.  Patient is to work on interacting more with family members that come over to his home for visits.  Patient is to try and get out of the house 1 day a week.  Patient is to continue working with  provider on medications. Long-term goal: Enhance ability to handle effectively the full variety of life's anxieties Short-term goal: Identify an anxiety coping mechanism that has been successful in the past and increase its use  [name], [facility]

## 2021-09-29 ENCOUNTER — Ambulatory Visit (INDEPENDENT_AMBULATORY_CARE_PROVIDER_SITE_OTHER): Payer: BC Managed Care – PPO | Admitting: Psychiatry

## 2021-09-29 DIAGNOSIS — F259 Schizoaffective disorder, unspecified: Secondary | ICD-10-CM

## 2021-09-29 NOTE — Progress Notes (Signed)
[facility] Counselor/Therapist Progress Note  Patient ID: Alan Morrow, MRN: [id number],    Date: 09/29/2021  Time Spent: 48 minutes start time 11:07 AM end time 11:55 AM Virtual Visit via Video Note Connected with patient by a telemedicine/telehealth application, with their informed consent, and verified patient privacy and that I am speaking with the correct person using two identifiers. I discussed the limitations, risks, security and privacy concerns of performing psychotherapy and the availability of in person appointments. I also discussed with the patient that there may be a patient responsible charge related to this service. The patient expressed understanding and agreed to proceed. I discussed the treatment planning with the patient. The patient was provided an opportunity to ask questions and all were answered. The patient agreed with the plan and demonstrated an understanding of the instructions. The patient was advised to call  our office if  symptoms worsen or feel they are in a crisis state and need immediate contact.   Therapist Location: office Patient Location: home    Treatment Type: Individual Therapy  Reported Symptoms: hallucinations, intrusive thoughts, anxiety, focusing issues, rumination  Mental Status Exam:  Appearance:   Casual     Behavior:  Appropriate  Motor:  Normal  Speech/Language:   Normal Rate  Affect:  Appropriate  Mood:  anxious  Thought process:  normal  Thought content:    Hallucinations: Auditory  Sensory/Perceptual disturbances:    WNL  Orientation:  oriented to person, place, time/date, and situation  Attention:  Good  Concentration:  Good  Memory:  WNL  Fund of knowledge:   Good  Insight:    Fair  Judgment:   Good  Impulse Control:  Good   Risk Assessment: Danger to Self:  Yes.  without intent/plan Self-injurious Behavior: No Danger to Others: No Duty to Warn:no Physical Aggression / Violence:No  Access to Firearms a  concern: No  Gang Involvement:No   Subjective: Met with patient via virtual session.  He reported that he would be starting a new medication. He is hopeful it would be helpful.  He has been able to follow through with plans and talk with his cousin some and he feels that helped and he is feeling better about Thanksgiving.  He shared that he feels the voices are the hardest thing for him. He was able to share that the voices have told him he won't get better and that is disturbing to him.  Did processing set on the voices, suds level 10, negative cognition "I want to get back to normal" felt anxiety and frustration in his head and all over.  Patient was able to reduce suds level to 5.  He was able to recognize that he has had normal in the past so he can have normal again it just may take a while.  Discussed the importance of focusing on the facts whenever those voices are telling him the negative things.  Interventions: Cognitive Behavioral Therapy, Solution-Oriented/Positive Psychology, and Eye Movement Desensitization and Reprocessing (EMDR)  Diagnosis:   ICD-10-CM   1. Schizoaffective disorder, unspecified type ([place])  F25.9       Plan: Patient is to use CBT and coping skills to decrease negative emotions.  Patient is to work on reminding himself of the facts/truths when the voices are telling him negatives.  Patient is to continue working on spending time with people outside of his room.  Patient is to use coping skills discussed in session to manage Thanksgiving with his  family.  Patient is to take medication as directed and continuing to work with provider Long-term goal: Enhance ability to handle effectively the full variety of life's anxieties Short-term goal: Identify an anxiety coping mechanism that has been successful in the past and increase its use    [name], [facility]

## 2021-10-13 ENCOUNTER — Ambulatory Visit (INDEPENDENT_AMBULATORY_CARE_PROVIDER_SITE_OTHER): Payer: BC Managed Care – PPO | Admitting: Psychiatry

## 2021-10-13 DIAGNOSIS — F259 Schizoaffective disorder, unspecified: Secondary | ICD-10-CM

## 2021-10-13 NOTE — Progress Notes (Signed)
[facility] Counselor/Therapist Progress Note  Patient ID: Alan Morrow, MRN: [id number],    Date: 10/13/2021  Time Spent: 45 minutes start time 9:05 AM end time 9:50 Virtual Visit via Video Note Connected with patient by a telemedicine/telehealth application, with their informed consent, and verified patient privacy and that I am speaking with the correct person using two identifiers. I discussed the limitations, risks, security and privacy concerns of performing psychotherapy and the availability of in person appointments. I also discussed with the patient that there may be a patient responsible charge related to this service. The patient expressed understanding and agreed to proceed. I discussed the treatment planning with the patient. The patient was provided an opportunity to ask questions and all were answered. The patient agreed with the plan and demonstrated an understanding of the instructions. The patient was advised to call  our office if  symptoms worsen or feel they are in a crisis state and need immediate contact.   Therapist Location: office Patient Location: home    Treatment Type: Individual Therapy  Reported Symptoms: Hallucinations, anxiety, sleep issues, sadness, focusing issues  Mental Status Exam:  Appearance:   Casual     Behavior:  Appropriate  Motor:  Normal  Speech/Language:   Normal Rate  Affect:  Appropriate  Mood:  anxious  Thought process:  normal  Thought content:    Hallucinations: Auditory  Sensory/Perceptual disturbances:    Hallucinations: Auditory  Orientation:  oriented to person, place, time/date, and situation  Attention:  Good  Concentration:  Good  Memory:  WNL  Fund of knowledge:   Fair  Insight:    Good  Judgment:   Good  Impulse Control:  Good   Risk Assessment: Danger to Self:  No Self-injurious Behavior: No Danger to Others: No Duty to Warn:no Physical Aggression / Violence:No  Access to Firearms a concern: No  Gang  Involvement:No   Subjective: Met with patient via virtual session.  He shared that he was able to spend an hour with his whole family and than would have cousins his room 1 on 1 for other time. He shared that the new medication has helped him to thin clearer but the voices are still there. He shared they are moving in a few days so most of his time currently have been focused on packing and helping his parents pack at this time since they will be moving in a few days.  Patient shared that he is ready just to get things settled.  He went on to report that once things are settled with his home he plans doing online programs discussed different options and ways to think through what would be the best opportunity for him.  Patient acknowledged she is still having lots of issues with being in public and around other people.  He shared he is needing to go Christmas shopping and knows he needs to get out developed a plan that would be functional for him.  Also reviewed coping skills that he found to be helpful and discussed how to make sure he continues to utilize what works for him so that he can continue to improve his functioning.  Interventions: Cognitive Behavioral Therapy and Solution-Oriented/Positive Psychology  Diagnosis:   ICD-10-CM   1. Schizoaffective disorder, unspecified type ([place])  F25.9       Plan: Patient is to use CBT and coping skills to decrease anxiety symptoms.  Patient is to follow plans from session to help him get out  and Christmas shop.  Patient is to work on having more contact with family and getting out of his room more.  Patient is to take medication as directed Long-term goal: Enhance ability to handle effectively the full variety of life's anxieties Short-term goal: Identify an anxiety coping mechanism that has been successful in the past and increase its use  [name], [facility]

## 2021-10-27 ENCOUNTER — Ambulatory Visit (INDEPENDENT_AMBULATORY_CARE_PROVIDER_SITE_OTHER): Payer: BC Managed Care – PPO | Admitting: Psychiatry

## 2021-10-27 DIAGNOSIS — F259 Schizoaffective disorder, unspecified: Secondary | ICD-10-CM | POA: Diagnosis not present

## 2021-10-27 NOTE — Progress Notes (Addendum)
[facility] Counselor/Therapist Progress Note  Patient ID: Alan Morrow, MRN: [id number],    Date: 10/27/2021  Time Spent: 49 minutes start time 9:09 AM end time 9:58 AM Virtual Visit via Video Note Connected with patient by a telemedicine/telehealth application, with their informed consent, and verified patient privacy and that I am speaking with the correct person using two identifiers. I discussed the limitations, risks, security and privacy concerns of performing psychotherapy and the availability of in person appointments. I also discussed with the patient that there may be a patient responsible charge related to this service. The patient expressed understanding and agreed to proceed. I discussed the treatment planning with the patient. The patient was provided an opportunity to ask questions and all were answered. The patient agreed with the plan and demonstrated an understanding of the instructions. The patient was advised to call  our office if  symptoms worsen or feel they are in a crisis state and need immediate contact.   Therapist Location: office Patient Location: home    Treatment Type: Individual Therapy  Reported Symptoms: anxiety, hallucination only auditory now,sleep issues  Mental Status Exam:  Appearance:   Casual     Behavior:  Appropriate  Motor:  Normal  Speech/Language:   Normal Rate  Affect:  Appropriate  Mood:  anxious  Thought process:  normal  Thought content:    WNL  Sensory/Perceptual disturbances:    WNL  Orientation:  oriented to person, place, time/date, and situation  Attention:  Good  Concentration:  Good  Memory:  WNL  Fund of knowledge:   Fair  Insight:    Fair  Judgment:   Good  Impulse Control:  Good   Risk Assessment: Danger to Self:  None current but he shared that he does have them at times but he recognizes that they are there and he isn't going to act on them. Self-injurious Behavior: No Danger to Others: No Duty to  Warn:no Physical Aggression / Violence:No  Access to Firearms a concern: No  Gang Involvement:No   Subjective: Met with patient via virtual session.  He reported that things continue to be very stressful at home because they were supposed to move and then the house fell through so they are not moving.  He reported that overall he is making progress because he is no longer having visual hallucinations and he feels like the auditory and intrusive thoughts he is able to manage appropriately and is feeling better about that situation.  Patient stated he had even decided that he would go out to see a movie to a movie theater.  Encouraged patient to feel how positive that progress was and discussed ways to do it in a manner that would not feel overwhelming and he would be able to manage his emotions appropriately.  Patient stated the thing that gets him upset with himself and sometimes seems to cause the intrusive thoughts is him not in school currently processed that issue suds level 8, negative cognition "I am not doing what I could be" felt frustration in his head.  Patient was able to reduce suds level to 5.  Patient was encouraged to realize he did not because the situation he is currently and that as he continues to progress he can get back into his school and doing the things that he wants to.  Patient reported feeling positive at the end of session  Interventions: Cognitive Behavioral Therapy, Solution-Oriented/Positive Psychology, Eye Movement Desensitization and Reprocessing (EMDR), and  Insight-Oriented  Diagnosis:   ICD-10-CM   1. Schizoaffective disorder, unspecified type ([place])  F25.9       Plan: Patient is to use CBT and coping skills to decrease anxiety symptoms.  Patient is to work on plan from session to go in public to a movie.  Patient is to continue working with his provider on medication and taking as directed.  Patient is to work on getting out of his room more and interacting with  family. Long-term goal: Enhance ability to handle effectively the full variety of life's anxieties Short-term goal: Identify an anxiety coping mechanism that has been successful in the past and increase its use    [name], [facility]
